# Patient Record
Sex: Male | Born: 1970 | Race: Black or African American | Hispanic: No | Marital: Married | Smoking: Never smoker
Health system: Southern US, Community
[De-identification: ages and names within clinical notes are randomized; demographics above are authoritative.]

## PROBLEM LIST (undated history)

## (undated) DIAGNOSIS — K219 Gastro-esophageal reflux disease without esophagitis: Secondary | ICD-10-CM

## (undated) DIAGNOSIS — F419 Anxiety disorder, unspecified: Secondary | ICD-10-CM

## (undated) DIAGNOSIS — I10 Essential (primary) hypertension: Secondary | ICD-10-CM

## (undated) HISTORY — PX: VASECTOMY: SHX75

---

## 2016-04-17 ENCOUNTER — Emergency Department (HOSPITAL_COMMUNITY)
Admission: EM | Admit: 2016-04-17 | Discharge: 2016-04-17 | Disposition: A | Discharge status: Home / Self Care | Payer: No Typology Code available for payment source | Attending: Emergency Medicine | Admitting: Emergency Medicine

## 2016-04-17 ENCOUNTER — Emergency Department (HOSPITAL_COMMUNITY): Payer: No Typology Code available for payment source

## 2016-04-17 ENCOUNTER — Encounter (HOSPITAL_COMMUNITY): Payer: Self-pay | Admitting: *Deleted

## 2016-04-17 DIAGNOSIS — Y999 Unspecified external cause status: Secondary | ICD-10-CM | POA: Diagnosis not present

## 2016-04-17 DIAGNOSIS — R079 Chest pain, unspecified: Secondary | ICD-10-CM | POA: Diagnosis present

## 2016-04-17 DIAGNOSIS — Y92410 Unspecified street and highway as the place of occurrence of the external cause: Secondary | ICD-10-CM | POA: Insufficient documentation

## 2016-04-17 DIAGNOSIS — M546 Pain in thoracic spine: Secondary | ICD-10-CM | POA: Diagnosis not present

## 2016-04-17 DIAGNOSIS — I10 Essential (primary) hypertension: Secondary | ICD-10-CM | POA: Insufficient documentation

## 2016-04-17 DIAGNOSIS — R1032 Left lower quadrant pain: Secondary | ICD-10-CM | POA: Insufficient documentation

## 2016-04-17 DIAGNOSIS — Y939 Activity, unspecified: Secondary | ICD-10-CM | POA: Diagnosis not present

## 2016-04-17 HISTORY — DX: Gastro-esophageal reflux disease without esophagitis: K21.9

## 2016-04-17 HISTORY — DX: Essential (primary) hypertension: I10

## 2016-04-17 HISTORY — DX: Anxiety disorder, unspecified: F41.9

## 2016-04-17 LAB — URINALYSIS, ROUTINE W REFLEX MICROSCOPIC
Bacteria, UA: NONE SEEN
Bilirubin Urine: NEGATIVE
Glucose, UA: NEGATIVE mg/dL
Hgb urine dipstick: NEGATIVE
Ketones, ur: NEGATIVE mg/dL
Nitrite: NEGATIVE
Protein, ur: NEGATIVE mg/dL
Specific Gravity, Urine: >1.046 — ABNORMAL HIGH (ref 1.005–1.030)
pH: 5 (ref 5.0–8.0)

## 2016-04-17 LAB — I-STAT CHEM 8, ED
BUN: 20 mg/dL (ref 6–20)
Calcium, Ion: 1.16 mmol/L (ref 1.15–1.40)
Chloride: 100 mmol/L — ABNORMAL LOW (ref 101–111)
Creatinine, Ser: 1.3 mg/dL — ABNORMAL HIGH (ref 0.61–1.24)
Glucose, Bld: 114 mg/dL — ABNORMAL HIGH (ref 65–99)
HCT: 49 % (ref 39.0–52.0)
Hemoglobin: 16.7 g/dL (ref 13.0–17.0)
Potassium: 4.3 mmol/L (ref 3.5–5.1)
Sodium: 140 mmol/L (ref 135–145)
TCO2: 33 mmol/L (ref 0–100)

## 2016-04-17 LAB — CBC
HCT: 43.7 % (ref 39.0–52.0)
Hemoglobin: 14.6 g/dL (ref 13.0–17.0)
MCH: 28 pg (ref 26.0–34.0)
MCHC: 33.4 g/dL (ref 30.0–36.0)
MCV: 83.9 fL (ref 78.0–100.0)
Platelets: 372 10*3/uL (ref 150–400)
RBC: 5.21 MIL/uL (ref 4.22–5.81)
RDW: 14.4 % (ref 11.5–15.5)
WBC: 6.2 10*3/uL (ref 4.0–10.5)

## 2016-04-17 LAB — PROTIME-INR
INR: 0.94
Prothrombin Time: 12.5 seconds (ref 11.4–15.2)

## 2016-04-17 LAB — COMPREHENSIVE METABOLIC PANEL
ALT: 18 U/L (ref 17–63)
AST: 23 U/L (ref 15–41)
Albumin: 4.7 g/dL (ref 3.5–5.0)
Alkaline Phosphatase: 88 U/L (ref 38–126)
Anion gap: 7 (ref 5–15)
BUN: 14 mg/dL (ref 6–20)
CO2: 28 mmol/L (ref 22–32)
Calcium: 9.6 mg/dL (ref 8.9–10.3)
Chloride: 100 mmol/L — ABNORMAL LOW (ref 101–111)
Creatinine, Ser: 1.3 mg/dL — ABNORMAL HIGH (ref 0.61–1.24)
GFR calc Af Amer: >60 mL/min (ref >60)
GFR calc non Af Amer: >60 mL/min (ref >60)
Glucose, Bld: 114 mg/dL — ABNORMAL HIGH (ref 65–99)
Potassium: 3.3 mmol/L — ABNORMAL LOW (ref 3.5–5.1)
Sodium: 135 mmol/L (ref 135–145)
Total Bilirubin: 0.4 mg/dL (ref 0.3–1.2)
Total Protein: 9.6 g/dL — ABNORMAL HIGH (ref 6.5–8.1)

## 2016-04-17 LAB — SAMPLE TO BLOOD BANK

## 2016-04-17 LAB — I-STAT CG4 LACTIC ACID, ED: Lactic Acid, Venous: 1.11 mmol/L (ref 0.5–1.9)

## 2016-04-17 LAB — CDS SEROLOGY

## 2016-04-17 MED ORDER — ATROPINE SULFATE 1 MG/ML IJ SOLN
INTRAMUSCULAR | Status: AC
  Filled 2016-04-17: qty 1

## 2016-04-17 MED ORDER — GLYCOPYRROLATE 0.2 MG/ML IJ SOLN
INTRAMUSCULAR | Status: DC
Start: 2016-04-17 — End: 2016-04-17
  Filled 2016-04-17: qty 1

## 2016-04-17 MED ORDER — IOPAMIDOL (ISOVUE-300) INJECTION 61%
INTRAVENOUS | Status: AC
  Administered 2016-04-17: 100 mL via INTRAVENOUS
  Filled 2016-04-17: qty 100
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
## 2016-04-17 NOTE — ED Notes (Signed)
Assisted patient to restroom after CT scan.

## 2016-04-17 NOTE — ED Notes (Signed)
Pt in CT.

## 2016-04-17 NOTE — ED Notes (Signed)
Pt is unable to urinate at this time but aware a urine a sample is needed.

## 2016-04-17 NOTE — Discharge Instructions (Signed)
Please rest, use Tylenol or ibuprofen as needed for discomfort.  If you develop any new or worsening signs or symptoms please return immediately for repeat evaluation.
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
## 2021-06-06 ENCOUNTER — Emergency Department (INDEPENDENT_AMBULATORY_CARE_PROVIDER_SITE_OTHER)
Admission: EM | Admit: 2021-06-06 | Discharge: 2021-06-06 | Disposition: A | Discharge status: Home / Self Care | Payer: BC Managed Care – PPO | Source: Home / Self Care

## 2021-06-06 DIAGNOSIS — T783XXA Angioneurotic edema, initial encounter: Secondary | ICD-10-CM

## 2021-06-06 DIAGNOSIS — T464X5A Adverse effect of angiotensin-converting-enzyme inhibitors, initial encounter: Secondary | ICD-10-CM

## 2021-06-06 DIAGNOSIS — L508 Other urticaria: Secondary | ICD-10-CM

## 2021-06-06 MED ORDER — LOSARTAN POTASSIUM 25 MG PO TABS: 25.0000 mg | ORAL_TABLET | Freq: Every day | ORAL | 0 refills | Status: AC

## 2021-06-06 MED ORDER — PREDNISONE 20 MG PO TABS: 40.0000 mg | ORAL_TABLET | Freq: Every day | ORAL | 0 refills | Status: AC

## 2021-06-06 MED ORDER — METHYLPREDNISOLONE SODIUM SUCC 125 MG IJ SOLR
125.0000 mg | Freq: Once | INTRAMUSCULAR | Status: AC
  Administered 2021-06-06: 125 mg via INTRAMUSCULAR

## 2021-06-06 NOTE — ED Provider Notes (Signed)
?[facility] ? ? ? ?CSN: [id number] ?Arrival date & time: 06/06/21  1906 ? ? ?  ? ?History   ?Chief Complaint ?Chief Complaint  ?Patient presents with  ? Facial Swelling  ? ? ?HPI ?Paxten Appelt is a 51 y.o. male.  ? ?HPI ? ?Patient states he woke up this morning with swelling in his upper lip.  Its been present all day.  He put a cool compress on it.  He does not know that he is allergic to anything.  He is on an ACE inhibitor for blood pressure.  He has been on this many years without difficulty.  As he is sitting in the office waiting for me he developed an itchy rash.  When I walked in the room he is scratching at the skin.  No difficulty speaking.  No difficulty swallowing.  No difficulty breathing.  No prior similar reaction ? ?Past Medical History:  ?Diagnosis Date  ? Anxiety   ? GERD (gastroesophageal reflux disease)   ? Hypertension   ? ? ?There are no problems to display for this patient. ? ? ?Past Surgical History:  ?Procedure Laterality Date  ? VASECTOMY    ? ? ? ? ? ?Home Medications   ? ?Prior to Admission medications   ?Medication Sig Start Date End Date Taking? Authorizing Provider  ?amLODipine (NORVASC) 10 MG tablet Take 10 mg by mouth daily.   Yes [provider]  ?escitalopram (LEXAPRO) 10 MG tablet Take 10 mg by mouth daily.   Yes [provider]  ?losartan (COZAAR) 25 MG tablet Take 1 tablet (25 mg total) by mouth daily. 06/06/21  Yes Eustace Moore, MD  ?pantoprazole (PROTONIX) 40 MG tablet Take 40 mg by mouth daily.   Yes [provider]  ?predniSONE (DELTASONE) 20 MG tablet Take 2 tablets (40 mg total) by mouth daily with breakfast. 06/06/21  Yes Eustace Moore, MD  ? ? ?Family History ?History reviewed. No pertinent family history. ? ?Social History ?Social History  ? ?Tobacco Use  ? Smoking status: Never  ? Smokeless tobacco: Never  ?Substance Use Topics  ? Alcohol use: No  ? Drug use: No  ? ? ? ?Allergies   ?Benazepril hcl ? ? ?Review of  Systems ?Review of Systems ?See HPI ? ?Physical Exam ?Triage Vital Signs ?ED Triage Vitals  ?Enc Vitals Group  ?   BP 06/06/21 1919 (!) 127/92  ?   Pulse Rate 06/06/21 1919 90  ?   Resp 06/06/21 1919 14  ?   Temp 06/06/21 1919 98.8 ?F (37.1 ?C)  ?   Temp Source 06/06/21 1919 Oral  ?   SpO2 06/06/21 1919 96 %  ?   Weight --   ?   Height --   ?   Head Circumference --   ?   Peak Flow --   ?   Pain Score 06/06/21 1920 2  ?   Pain Loc --   ?   Pain Edu? --   ?   Excl. in GC? --   ? ?No data found. ? ?Updated Vital Signs ?BP (!) 127/92 (BP Location: Left Arm)   Pulse 90   Temp 98.8 ?F (37.1 ?C) (Oral)   Resp 14   SpO2 96%  ? ?   ? ?Physical Exam ?Constitutional:   ?   General: He is not in acute distress. ?   Appearance: He is well-developed. He is obese. He is not ill-appearing.  ?HENT:  ?  Head: Normocephalic and atraumatic.  ?   Right Ear: Tympanic membrane and ear canal normal.  ?   Left Ear: Tympanic membrane and ear canal normal.  ?   Nose: Rhinorrhea present. No congestion.  ?   Mouth/Throat:  ?   Mouth: Mucous membranes are moist.  ?   Comments: Upper lip is diffusely puffy and swollen.  Uvula and oropharynx are normal.  Voice is normal ?Eyes:  ?   Conjunctiva/sclera: Conjunctivae normal.  ?   Pupils: Pupils are equal, round, and reactive to light.  ?Cardiovascular:  ?   Rate and Rhythm: Normal rate and regular rhythm.  ?   Heart sounds: Normal heart sounds.  ?Pulmonary:  ?   Effort: Pulmonary effort is normal. No respiratory distress.  ?   Breath sounds: Normal breath sounds. No wheezing.  ?Abdominal:  ?   General: There is no distension.  ?   Palpations: Abdomen is soft.  ?Musculoskeletal:     ?   General: Normal range of motion.  ?   Cervical back: Normal range of motion.  ?Lymphadenopathy:  ?   Cervical: No cervical adenopathy.  ?Skin: ?   General: Skin is warm and dry.  ?Neurological:  ?   Mental Status: He is alert.  ?Psychiatric:     ?   Mood and Affect: Mood normal.     ?   Behavior: Behavior normal.   ? ? ? ?UC Treatments / Results  ?Labs ?(all labs ordered are listed, but only abnormal results are displayed) ?Labs Reviewed - No data to display ? ?EKG ? ? ?Radiology ?No results found. ? ?Procedures ?Procedures (including critical care time) ? ?Medications Ordered in UC ?Medications  ?methylPREDNISolone sodium succinate (SOLU-MEDROL) 125 mg/2 mL injection 125 mg (125 mg Intramuscular Given 06/06/21 1953)  ? ? ?Initial Impression / Assessment and Plan / UC Course  ?I have reviewed the triage vital signs and the nursing notes. ? ?Pertinent labs & imaging results that were available during my care of the patient were reviewed by me and considered in my medical decision making (see chart for details). ? ?  ? ?I believe the patient has angioedema and urticaria from his ACE inhibitor.  I have advised him to stop this medication.  We will put him on an ARB.  Have him follow-up with his primary care doctor ?Final Clinical Impressions(s) / UC Diagnoses  ? ?Final diagnoses:  ?Angioedema due to angiotensin converting enzyme inhibitor (ACE-I)  ?Urticaria, acute  ? ? ? ?Discharge Instructions   ? ?  ?STOP BENAZEPRIL ?Take losartan in its place ?Call your PCP tomorrow to arrange follow up ?Take the prednisone daily, starting tomorrow ?Take benadryl if needed for itching ? ?GO TO ER IF WORSE ? ? ?ED Prescriptions   ? ? Medication Sig Dispense Auth. Provider  ? losartan (COZAAR) 25 MG tablet Take 1 tablet (25 mg total) by mouth daily. 30 tablet Eustace Moore, MD  ? predniSONE (DELTASONE) 20 MG tablet Take 2 tablets (40 mg total) by mouth daily with breakfast. 10 tablet Eustace Moore, MD  ? ?  ? ?PDMP not reviewed this encounter. ?  ?Eustace Moore, MD ?06/06/21 2023 ? ?

## 2021-06-06 NOTE — Discharge Instructions (Addendum)
STOP BENAZEPRIL ?Take losartan in its place ?Call your PCP tomorrow to arrange follow up ?Take the prednisone daily, starting tomorrow ?Take benadryl if needed for itching ? ?GO TO ER IF WORSE ?

## 2021-06-06 NOTE — ED Triage Notes (Signed)
Pt presents with swelling of his upper lip that began this morning. Pt denies recent procedures, new medications or lip balms ?
